# Patient Record
Sex: Female | Born: 1985 | Race: Black or African American | Hispanic: No | Marital: Single | Smoking: Current every day smoker
Health system: Southern US, Community
[De-identification: ages and names within clinical notes are randomized; demographics above are authoritative.]

---

## 2015-11-24 ENCOUNTER — Encounter: Payer: Self-pay | Admitting: Emergency Medicine

## 2015-11-24 DIAGNOSIS — R42 Dizziness and giddiness: Secondary | ICD-10-CM | POA: Diagnosis present

## 2015-11-24 DIAGNOSIS — F17210 Nicotine dependence, cigarettes, uncomplicated: Secondary | ICD-10-CM | POA: Diagnosis not present

## 2015-11-24 DIAGNOSIS — F1920 Other psychoactive substance dependence, uncomplicated: Secondary | ICD-10-CM | POA: Diagnosis not present

## 2015-11-24 LAB — CBC
HCT: 36.6 % (ref 35.0–47.0)
HEMOGLOBIN: 12.4 g/dL (ref 12.0–16.0)
MCH: 27.9 pg (ref 26.0–34.0)
MCHC: 34 g/dL (ref 32.0–36.0)
MCV: 82.2 fL (ref 80.0–100.0)
Platelets: 241 10*3/uL (ref 150–440)
RBC: 4.45 MIL/uL (ref 3.80–5.20)
RDW: 19.4 % — ABNORMAL HIGH (ref 11.5–14.5)
WBC: 9.2 10*3/uL (ref 3.6–11.0)

## 2015-11-24 LAB — URINE DRUG SCREEN, QUALITATIVE (ARMC ONLY)
AMPHETAMINES, UR SCREEN: POSITIVE — AB
BENZODIAZEPINE, UR SCRN: NOT DETECTED
Barbiturates, Ur Screen: NOT DETECTED
Cannabinoid 50 Ng, Ur ~~LOC~~: NOT DETECTED
Cocaine Metabolite,Ur ~~LOC~~: POSITIVE — AB
MDMA (Ecstasy)Ur Screen: NOT DETECTED
METHADONE SCREEN, URINE: POSITIVE — AB
Opiate, Ur Screen: NOT DETECTED
PHENCYCLIDINE (PCP) UR S: NOT DETECTED
TRICYCLIC, UR SCREEN: NOT DETECTED

## 2015-11-24 LAB — BASIC METABOLIC PANEL
ANION GAP: 5 (ref 5–15)
BUN: 11 mg/dL (ref 6–20)
CALCIUM: 9 mg/dL (ref 8.9–10.3)
CO2: 26 mmol/L (ref 22–32)
Chloride: 108 mmol/L (ref 101–111)
Creatinine, Ser: 0.93 mg/dL (ref 0.44–1.00)
GFR calc Af Amer: >60 mL/min (ref >60)
GFR calc non Af Amer: >60 mL/min (ref >60)
Glucose, Bld: 88 mg/dL (ref 65–99)
POTASSIUM: 3.5 mmol/L (ref 3.5–5.1)
SODIUM: 139 mmol/L (ref 135–145)

## 2015-11-24 LAB — URINALYSIS COMPLETE WITH MICROSCOPIC (ARMC ONLY)
BACTERIA UA: NONE SEEN
Bilirubin Urine: NEGATIVE
Glucose, UA: NEGATIVE mg/dL
Hgb urine dipstick: NEGATIVE
Ketones, ur: NEGATIVE mg/dL
Leukocytes, UA: NEGATIVE
Nitrite: NEGATIVE
PH: 5 (ref 5.0–8.0)
PROTEIN: 30 mg/dL — AB
Specific Gravity, Urine: 1.025 (ref 1.005–1.030)

## 2015-11-24 LAB — TROPONIN I

## 2015-11-24 LAB — POCT PREGNANCY, URINE: PREG TEST UR: NEGATIVE

## 2015-11-24 NOTE — ED Triage Notes (Addendum)
Pt brought in to triage by EMS, ambulatory, steady gait and no distress noted, with c/o dizziness for the past and left leg and hip pain for 2 weeks due to laying on a air mattress. Pt reports she has had 3, 16 ounce beers tonight and used cocaine "the other day."

## 2015-11-25 ENCOUNTER — Emergency Department: Payer: Medicaid Other

## 2015-11-25 ENCOUNTER — Emergency Department
Admission: EM | Admit: 2015-11-25 | Discharge: 2015-11-25 | Disposition: A | Discharge status: Home / Self Care | Payer: Medicaid Other | Attending: Emergency Medicine | Admitting: Emergency Medicine

## 2015-11-25 DIAGNOSIS — R42 Dizziness and giddiness: Secondary | ICD-10-CM

## 2015-11-25 NOTE — ED Provider Notes (Signed)
[facility] Emergency Department Provider Note   First MD Initiated Contact with Patient 11/25/15 0024     (approximate)  I have reviewed the triage vital signs and the nursing notes.   HISTORY  Chief Complaint Leg Pain and Dizziness    HPI Barbara Sanford is a [age] y.o. female presents with history of dizziness for 30 minutes before presentation to the emergency department. Patient denies any weakness no numbness no gait instability or headache. Patient states that she drank 316 beers tonight and admits using cocaine 3 days ago. In addition the patient admits to left hip pain for 3 weeks which she thinks is secondary to sleeping on an air mattress. Patient states that she was given a methadone pill which she took for pain.    Past medical history None There are no active problems to display for this patient.   Past surgical history None  Prior to Admission medications   Not on File    Allergies No known drug allergies History reviewed. No pertinent family history.  Social History Social History  Substance Use Topics  . Smoking status: Current Every Day Smoker    Types: Cigarettes  . Smokeless tobacco: Never Used  . Alcohol use Yes    Review of Systems Constitutional: No fever/chills Eyes: No visual changes. ENT: No sore throat. Cardiovascular: Denies chest pain. Respiratory: Denies shortness of breath. Gastrointestinal: No abdominal pain.  No nausea, no vomiting.  No diarrhea.  No constipation. Genitourinary: Negative for dysuria. Musculoskeletal: Negative for back pain. Skin: Negative for rash. Neurological: Negative for headaches, focal weakness or numbness.  10-point ROS otherwise negative.  ____________________________________________   PHYSICAL EXAM:  VITAL SIGNS: ED Triage Vitals  Enc Vitals Group     BP 11/24/15 2258 137/67     Pulse Rate 11/24/15 2258 96     Resp 11/24/15 2258 16     Temp 11/24/15 2258 98.2 F  (36.8 C)     Temp Source 11/24/15 2258 Oral     SpO2 11/24/15 2258 100 %     Weight 11/24/15 2258 190 lb (86.2 kg)     Height 11/24/15 2258 5\' 4"  (1.626 m)     Head Circumference --      Peak Flow --      Pain Score 11/25/15 0011 0     Pain Loc --      Pain Edu? --      Excl. in GC? --     Constitutional: Alert and oriented. Well appearing and in no acute distress. Eyes: Conjunctivae are normal. PERRL. EOMI. Head: Atraumatic. Ears:  Healthy appearing ear canals, Left TM erythema fluid noted posterior Nose: No congestion/rhinnorhea. Mouth/Throat: Mucous membranes are moist.  Oropharynx non-erythematous. Neck: No stridor.  No meningeal signs.   Cardiovascular: Normal rate, regular rhythm. Good peripheral circulation. Grossly normal heart sounds. Respiratory: Normal respiratory effort.  No retractions. Lungs CTAB. Gastrointestinal: Soft and nontender. No distention.  Musculoskeletal: No lower extremity tenderness nor edema. No gross deformities of extremities. Neurologic:  Normal speech and language. No gross focal neurologic deficits are appreciated.  Skin:  Skin is warm, dry and intact. No rash noted. Psychiatric: Mood and affect are normal. Speech and behavior are normal.  ____________________________________________   LABS (all labs ordered are listed, but only abnormal results are displayed)  Labs Reviewed  CBC - Abnormal; Notable for the following:       Result Value   RDW 19.4 (*)    All other components  within normal limits  URINALYSIS COMPLETEWITH MICROSCOPIC ([facility] ONLY) - Abnormal; Notable for the following:    Color, Urine YELLOW (*)    APPearance CLOUDY (*)    Protein, ur 30 (*)    Squamous Epithelial / LPF 6-30 (*)    All other components within normal limits  URINE DRUG SCREEN, QUALITATIVE ([facility] ONLY) - Abnormal; Notable for the following:    Amphetamines, Ur Screen POSITIVE (*)    Cocaine Metabolite,Ur  POSITIVE (*)    Methadone Scn, Ur POSITIVE (*)     All other components within normal limits  BASIC METABOLIC PANEL  TROPONIN I  POC URINE PREG, ED  POCT PREGNANCY, URINE  CBG MONITORING, ED   ____________________________________________  EKG  ED ECG REPORT I, Panora N BROWN, the attending physician, personally viewed and interpreted this ECG.   Date: 11/25/2015  EKG Time: 11:03 PM  Rate: 82  Rhythm: Normal sinus rhythm  Axis: Normal  Intervals: Normal  ST&T Change: None  ____________________________________________  RADIOLOGY I,  N BROWN, personally viewed and evaluated these images (plain radiographs) as part of my medical decision making, as well as reviewing the written report by the radiologist. No results found.    Procedures     INITIAL IMPRESSION / ASSESSMENT AND PLAN / ED COURSE  Pertinent labs & imaging results that were available during my care of the patient were reviewed by me and considered in my medical decision making (see chart for details).    Clinical Course    ____________________________________________  FINAL CLINICAL IMPRESSION(S) / ED DIAGNOSES  Final diagnoses:  Dizziness  Polysubstance abuse   MEDICATIONS GIVEN DURING THIS VISIT:  Medications - No data to display   NEW OUTPATIENT MEDICATIONS STARTED DURING THIS VISIT:  New Prescriptions   No medications on file    Modified Medications   No medications on file    Discontinued Medications   No medications on file     Note:  This document was prepared using Dragon voice recognition software and may include unintentional dictation errors.    Darci Current, MD 11/25/15 [phone]

## 2015-11-25 NOTE — ED Notes (Signed)
Pt returned from CT °

## 2015-11-25 NOTE — ED Notes (Signed)
Pt sleeping soundly on nurse's arrival to room.

## 2015-11-25 NOTE — ED Notes (Addendum)
Pt stating, "I kept getting lightheaded." Pt stating dizziness has been happening since right before she arrived. Left hip pain started a couple weeks ago. Pt is on her phone and NAD at this time. Pt stating that she has no pain currently because the pain is positional but that it was hurting in the waiting room. Pt denying n/v, HA , or any illness prior to visit.

## 2015-11-25 NOTE — ED Notes (Signed)
Dr. Manson PasseyBrown and nurse at bedside with pt. Pt in NAD at this time. Pt is eating crackers and pt given a cup of water.

## 2018-03-28 IMAGING — CT CT HEAD W/O CM
3 series · 14 of 44 positions shown, 16 images · non-contrast
Comparison: None.

CLINICAL DATA: Acute onset of dizziness and lightheadedness.
Initial encounter.

EXAM:
CT HEAD WITHOUT CONTRAST
TECHNIQUE: Contiguous axial images were obtained from the base of the skull
through the vertex without intravenous contrast.

[Series 2: head wo · axial · 0.39mm/px · z∈[-123,-13]mm · 8 of 27 slices shown, 10 images]
[im 3/27  brain]
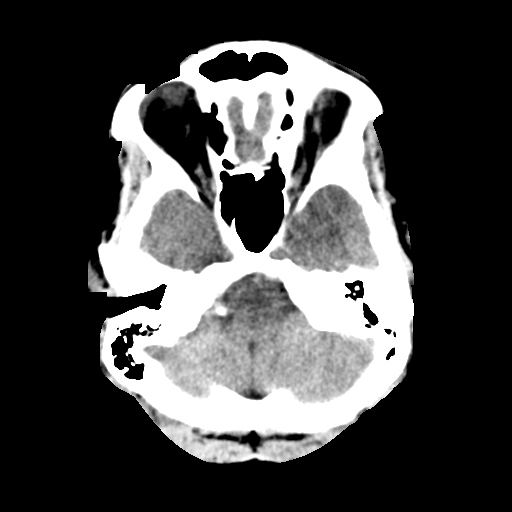
[im 3/27  bone]
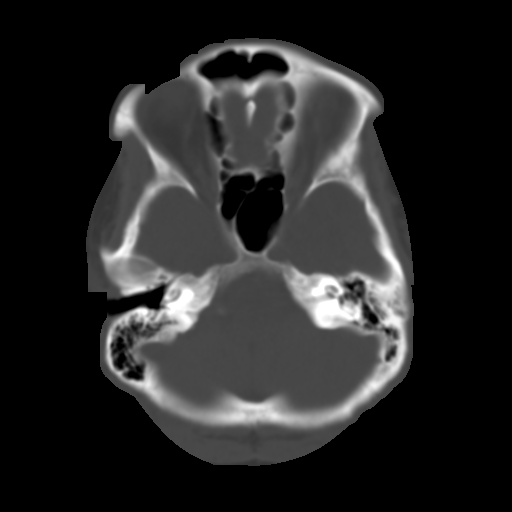
[im 6/27  brain]
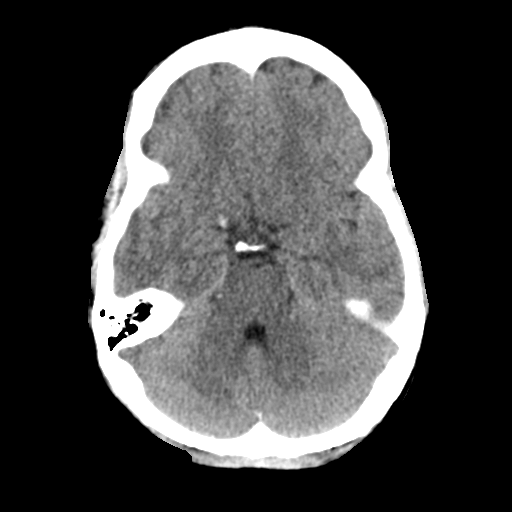
[im 9/27  brain]
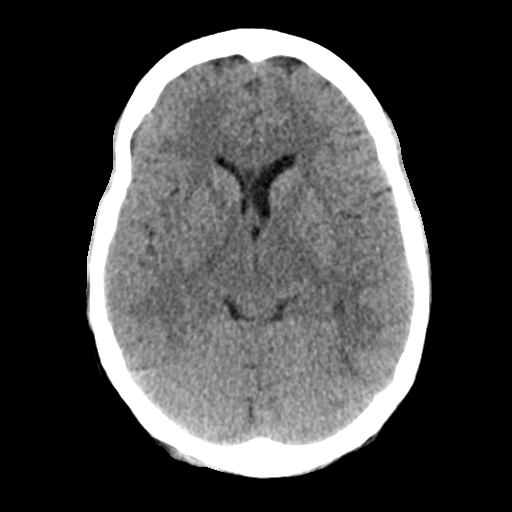
[im 12/27  brain]
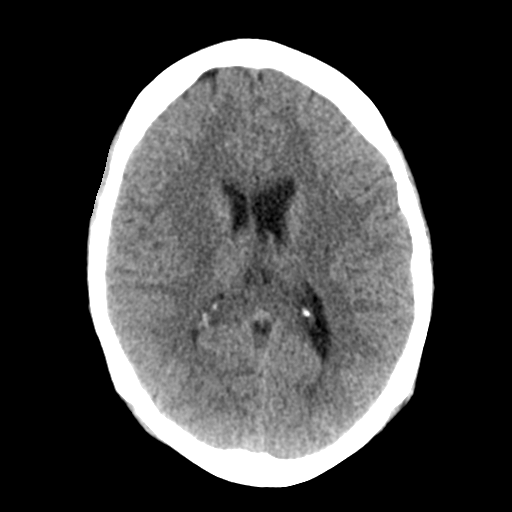
[im 16/27  brain]
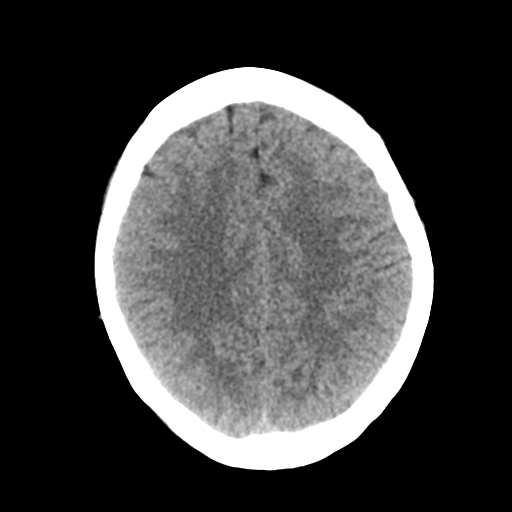
[im 16/27  bone]
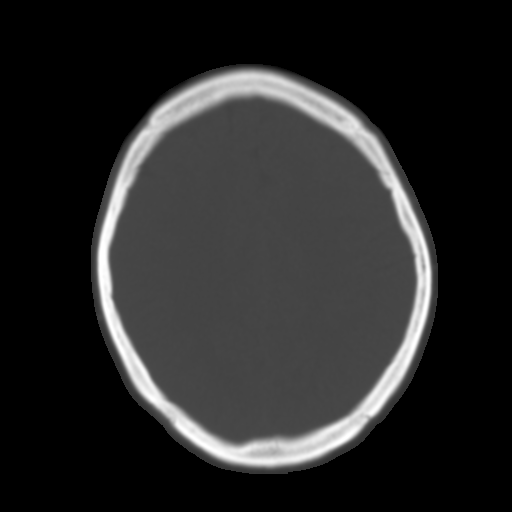
[im 19/27  brain]
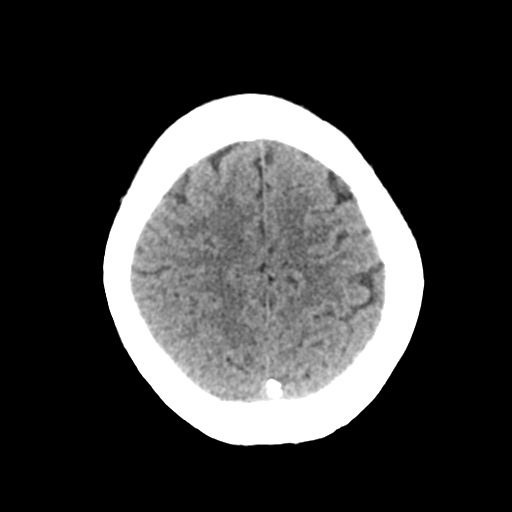
[im 22/27  brain]
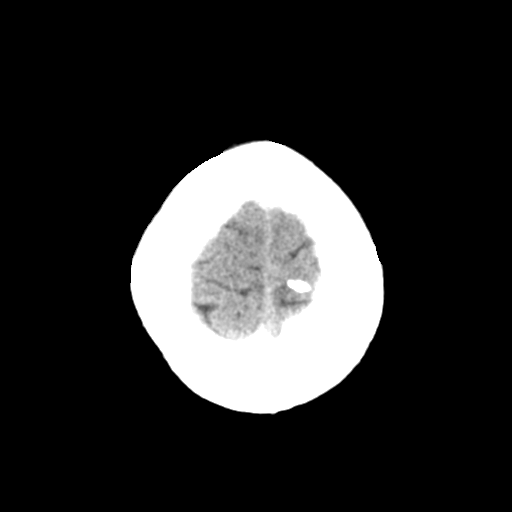
[im 25/27  brain]
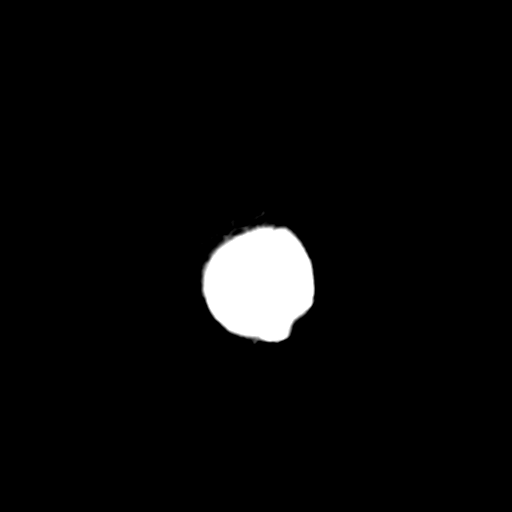

[Series 4: coronal soft tissue · coronal · 0.27mm/px · 3 of 58 slices shown]
[im 20/58  brain]
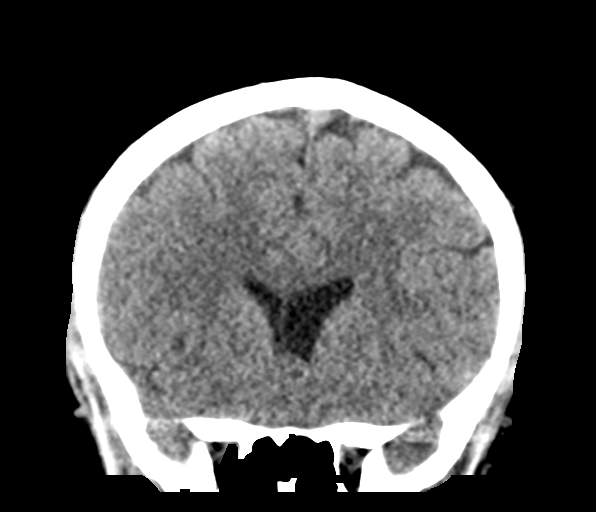
[im 26/58  brain]
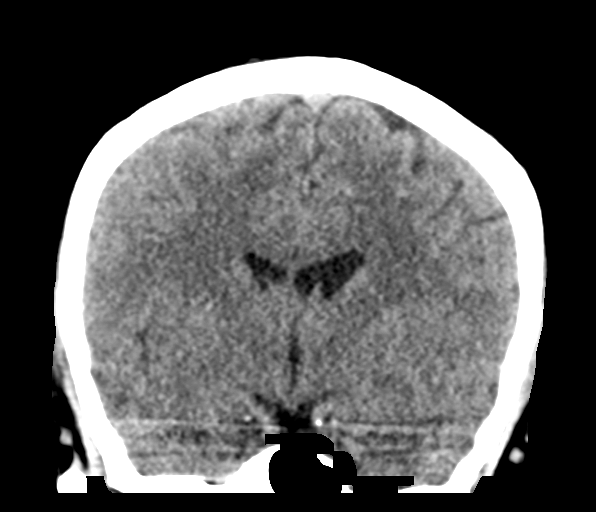
[im 32/58  brain]
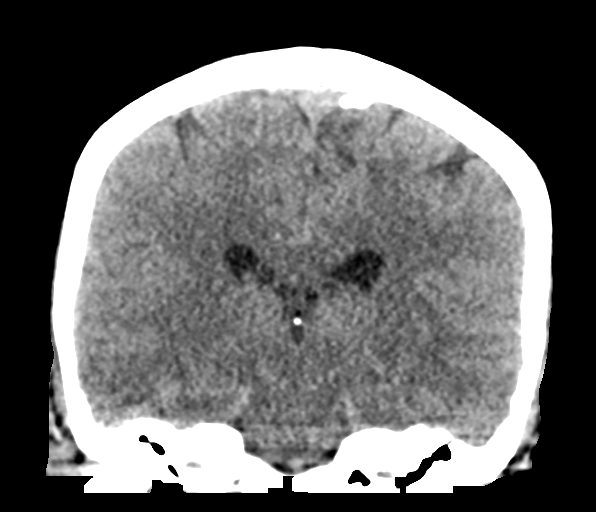

[Series 5: sagittal soft tissue · sagittal · 0.26mm/px · 3 of 49 slices shown]
[im 17/49  brain]
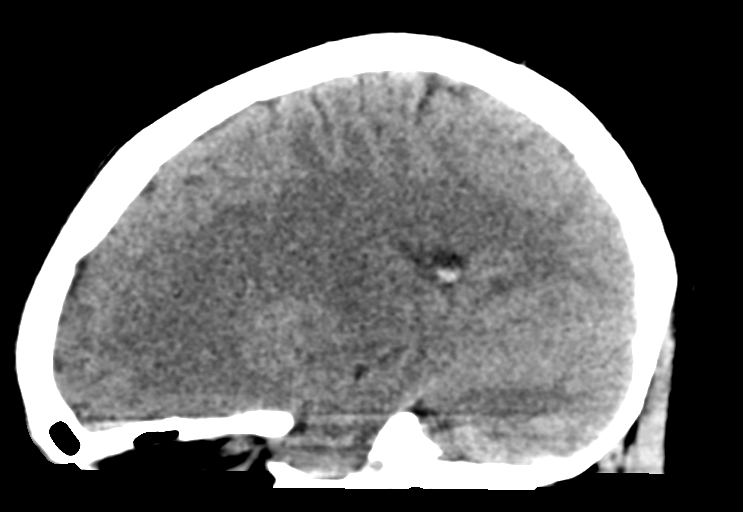
[im 25/49  brain]
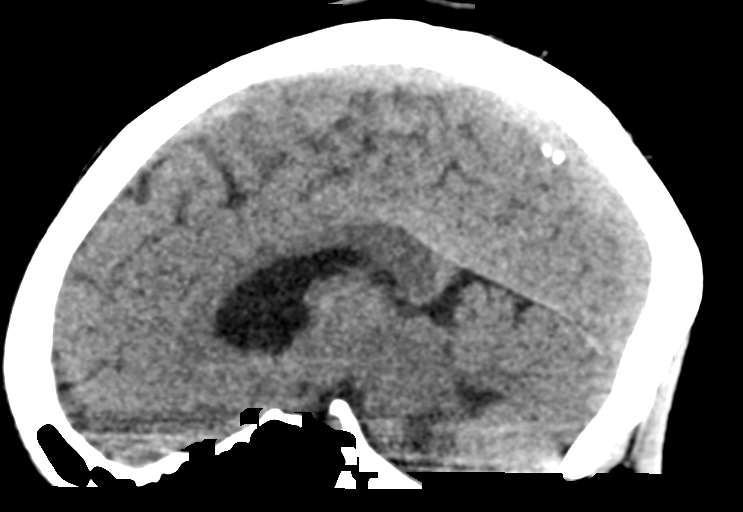
[im 33/49  brain]
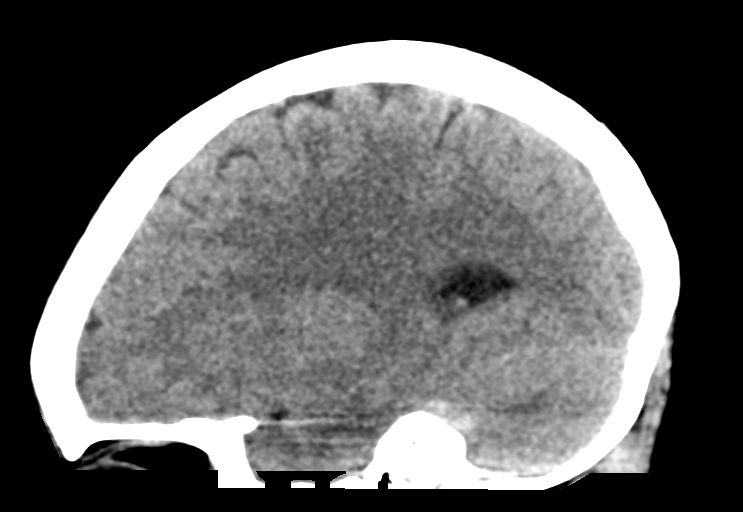

[14 of 44 positions shown; findings below may reference images not displayed]

FINDINGS: Brain:

No evidence of acute infarction, hemorrhage, hydrocephalus,
extra-axial collection or mass lesion/mass effect.

The posterior fossa, including the cerebellum, brainstem and fourth
ventricle, is within normal limits. The third and lateral
ventricles, and basal ganglia are unremarkable in appearance. The
cerebral hemispheres are symmetric in appearance, with normal
gray-white differentiation. No mass effect or midline shift is seen.

Slightly low-lying cerebellar tonsils are noted.

Vascular: No hyperdense vessel or unexpected calcification.

Skull: There is no evidence of fracture; visualized osseous
structures are unremarkable in appearance.

Sinuses/Orbits: The visualized portions of the orbits are within
normal limits. The paranasal sinuses and mastoid air cells are
well-aerated.

Other: No acute osseous abnormalities are identified. The visualized
musculature is unremarkable in appearance.
IMPRESSION: 1. No acute intracranial pathology seen on CT.
2. Slightly low-lying cerebellar tonsils noted.
